# Patient Record
Sex: Female | Born: 1945 | Hispanic: No | Marital: Married | Smoking: Never smoker
Health system: Southern US, Community
[De-identification: ages and names within clinical notes are randomized; demographics above are authoritative.]

## PROBLEM LIST (undated history)

## (undated) DIAGNOSIS — M1990 Unspecified osteoarthritis, unspecified site: Secondary | ICD-10-CM

## (undated) DIAGNOSIS — T8859XA Other complications of anesthesia, initial encounter: Secondary | ICD-10-CM

## (undated) DIAGNOSIS — I10 Essential (primary) hypertension: Secondary | ICD-10-CM

## (undated) DIAGNOSIS — T4145XA Adverse effect of unspecified anesthetic, initial encounter: Secondary | ICD-10-CM

## (undated) HISTORY — DX: Essential (primary) hypertension: I10

## (undated) HISTORY — DX: Other complications of anesthesia, initial encounter: T88.59XA

## (undated) HISTORY — DX: Unspecified osteoarthritis, unspecified site: M19.90

## (undated) HISTORY — DX: Adverse effect of unspecified anesthetic, initial encounter: T41.45XA

---

## 1980-06-06 HISTORY — PX: CHOLECYSTECTOMY: SHX55

## 1980-06-06 HISTORY — PX: STOMACH SURGERY: SHX791

## 1985-06-06 HISTORY — PX: SHOULDER SURGERY: SHX246

## 1997-06-06 HISTORY — PX: HAND SURGERY: SHX662

## 2015-10-13 DIAGNOSIS — Z961 Presence of intraocular lens: Secondary | ICD-10-CM | POA: Diagnosis not present

## 2016-06-10 DIAGNOSIS — M546 Pain in thoracic spine: Secondary | ICD-10-CM | POA: Diagnosis not present

## 2016-06-10 DIAGNOSIS — M25511 Pain in right shoulder: Secondary | ICD-10-CM | POA: Diagnosis not present

## 2016-06-10 DIAGNOSIS — M9902 Segmental and somatic dysfunction of thoracic region: Secondary | ICD-10-CM | POA: Diagnosis not present

## 2016-06-10 DIAGNOSIS — M9907 Segmental and somatic dysfunction of upper extremity: Secondary | ICD-10-CM | POA: Diagnosis not present

## 2016-06-14 DIAGNOSIS — M9902 Segmental and somatic dysfunction of thoracic region: Secondary | ICD-10-CM | POA: Diagnosis not present

## 2016-06-14 DIAGNOSIS — M25511 Pain in right shoulder: Secondary | ICD-10-CM | POA: Diagnosis not present

## 2016-06-14 DIAGNOSIS — M9907 Segmental and somatic dysfunction of upper extremity: Secondary | ICD-10-CM | POA: Diagnosis not present

## 2016-06-14 DIAGNOSIS — M546 Pain in thoracic spine: Secondary | ICD-10-CM | POA: Diagnosis not present

## 2016-06-16 DIAGNOSIS — M546 Pain in thoracic spine: Secondary | ICD-10-CM | POA: Diagnosis not present

## 2016-06-16 DIAGNOSIS — M9902 Segmental and somatic dysfunction of thoracic region: Secondary | ICD-10-CM | POA: Diagnosis not present

## 2016-06-16 DIAGNOSIS — M25511 Pain in right shoulder: Secondary | ICD-10-CM | POA: Diagnosis not present

## 2016-06-16 DIAGNOSIS — M9907 Segmental and somatic dysfunction of upper extremity: Secondary | ICD-10-CM | POA: Diagnosis not present

## 2016-06-20 DIAGNOSIS — M9907 Segmental and somatic dysfunction of upper extremity: Secondary | ICD-10-CM | POA: Diagnosis not present

## 2016-06-20 DIAGNOSIS — M9902 Segmental and somatic dysfunction of thoracic region: Secondary | ICD-10-CM | POA: Diagnosis not present

## 2016-06-20 DIAGNOSIS — M25511 Pain in right shoulder: Secondary | ICD-10-CM | POA: Diagnosis not present

## 2016-06-20 DIAGNOSIS — M546 Pain in thoracic spine: Secondary | ICD-10-CM | POA: Diagnosis not present

## 2016-06-27 DIAGNOSIS — M546 Pain in thoracic spine: Secondary | ICD-10-CM | POA: Diagnosis not present

## 2016-06-27 DIAGNOSIS — M25511 Pain in right shoulder: Secondary | ICD-10-CM | POA: Diagnosis not present

## 2016-06-27 DIAGNOSIS — M9907 Segmental and somatic dysfunction of upper extremity: Secondary | ICD-10-CM | POA: Diagnosis not present

## 2016-06-27 DIAGNOSIS — M9902 Segmental and somatic dysfunction of thoracic region: Secondary | ICD-10-CM | POA: Diagnosis not present

## 2016-07-01 DIAGNOSIS — M9907 Segmental and somatic dysfunction of upper extremity: Secondary | ICD-10-CM | POA: Diagnosis not present

## 2016-07-01 DIAGNOSIS — M25511 Pain in right shoulder: Secondary | ICD-10-CM | POA: Diagnosis not present

## 2016-07-01 DIAGNOSIS — M9902 Segmental and somatic dysfunction of thoracic region: Secondary | ICD-10-CM | POA: Diagnosis not present

## 2016-07-01 DIAGNOSIS — M546 Pain in thoracic spine: Secondary | ICD-10-CM | POA: Diagnosis not present

## 2016-07-06 DIAGNOSIS — M9902 Segmental and somatic dysfunction of thoracic region: Secondary | ICD-10-CM | POA: Diagnosis not present

## 2016-07-06 DIAGNOSIS — M25511 Pain in right shoulder: Secondary | ICD-10-CM | POA: Diagnosis not present

## 2016-07-06 DIAGNOSIS — M9907 Segmental and somatic dysfunction of upper extremity: Secondary | ICD-10-CM | POA: Diagnosis not present

## 2016-07-06 DIAGNOSIS — M546 Pain in thoracic spine: Secondary | ICD-10-CM | POA: Diagnosis not present

## 2016-07-12 DIAGNOSIS — M25511 Pain in right shoulder: Secondary | ICD-10-CM | POA: Diagnosis not present

## 2016-07-12 DIAGNOSIS — M546 Pain in thoracic spine: Secondary | ICD-10-CM | POA: Diagnosis not present

## 2016-07-12 DIAGNOSIS — M9907 Segmental and somatic dysfunction of upper extremity: Secondary | ICD-10-CM | POA: Diagnosis not present

## 2016-07-12 DIAGNOSIS — M9902 Segmental and somatic dysfunction of thoracic region: Secondary | ICD-10-CM | POA: Diagnosis not present

## 2016-07-21 DIAGNOSIS — M9907 Segmental and somatic dysfunction of upper extremity: Secondary | ICD-10-CM | POA: Diagnosis not present

## 2016-07-21 DIAGNOSIS — M25511 Pain in right shoulder: Secondary | ICD-10-CM | POA: Diagnosis not present

## 2016-07-21 DIAGNOSIS — M9902 Segmental and somatic dysfunction of thoracic region: Secondary | ICD-10-CM | POA: Diagnosis not present

## 2016-07-21 DIAGNOSIS — M546 Pain in thoracic spine: Secondary | ICD-10-CM | POA: Diagnosis not present

## 2016-07-25 ENCOUNTER — Ambulatory Visit (INDEPENDENT_AMBULATORY_CARE_PROVIDER_SITE_OTHER): Payer: Medicare Other | Admitting: Orthopedic Surgery

## 2016-07-25 ENCOUNTER — Ambulatory Visit (HOSPITAL_COMMUNITY)
Admission: RE | Admit: 2016-07-25 | Discharge: 2016-07-25 | Disposition: A | Discharge status: Home / Self Care | Payer: Medicare Other | Source: Ambulatory Visit | Attending: Orthopedic Surgery | Admitting: Orthopedic Surgery

## 2016-07-25 ENCOUNTER — Encounter: Payer: Self-pay | Admitting: Orthopedic Surgery

## 2016-07-25 VITALS — BP 169/80 | HR 92 | Ht 60.5 in | Wt 157.0 lb

## 2016-07-25 DIAGNOSIS — M7551 Bursitis of right shoulder: Secondary | ICD-10-CM | POA: Diagnosis not present

## 2016-07-25 DIAGNOSIS — M19011 Primary osteoarthritis, right shoulder: Secondary | ICD-10-CM | POA: Diagnosis not present

## 2016-07-25 DIAGNOSIS — M25511 Pain in right shoulder: Secondary | ICD-10-CM | POA: Insufficient documentation

## 2016-07-25 DIAGNOSIS — I708 Atherosclerosis of other arteries: Secondary | ICD-10-CM | POA: Insufficient documentation

## 2016-07-25 DIAGNOSIS — S4991XA Unspecified injury of right shoulder and upper arm, initial encounter: Secondary | ICD-10-CM | POA: Diagnosis not present

## 2016-07-25 NOTE — Progress Notes (Signed)
New Patient History and Physical   Patient ID: Laura Mercado, female   DOB: 10-20-1945, 71 y.o.   MRN: [id number]  Chief Complaint  Patient presents with  . Shoulder Pain    right shoulder pain s/p fall 05/15/17    HPI Laura Mercado is a 71 y.o. female.  Who presents with painful right shoulder after falling on December 10. She had a rotator cuff repair about 30 years ago. Well. She moved into a new house recently and was moving boxes and felt some discomfort in the shoulder but nothing serious or severe until she fell. She actually landed on the left side the right shoulder jerked and since that time she's had persistent pain over the anterolateral right shoulder. Her range of motion has improved since that time but still having pain and catching at 90 flexion.  Review of Systems Review of Systems  Musculoskeletal: Positive for arthralgias.  Neurological: Negative for weakness and numbness.    Past Medical History:  Diagnosis Date  . Arthritis   . Complication of anesthesia   . High blood pressure     Past Surgical History:  Procedure Laterality Date  . CHOLECYSTECTOMY  1982  . HAND SURGERY Right 1999   basil joint  . SHOULDER SURGERY Right 1987   rotator cuff  . STOMACH SURGERY  1982   weight loss band    Family History  Problem Relation Age of Onset  . Heart disease Mother   . Alzheimer's disease Father     Social History Social History  Substance Use Topics  . Smoking status: Never Smoker  . Smokeless tobacco: Never Used  . Alcohol use Not on file    Allergies  Allergen Reactions  . Codeine     No current outpatient prescriptions on file.   No current facility-administered medications for this visit.     Physical Exam BP (!) 169/80   Pulse 92   Ht 5' 0.5" (1.537 m)   Wt 157 lb (71.2 kg)   BMI 30.16 kg/m  Physical Exam  Constitutional: She is oriented to person, place, and time. She appears well-developed and well-nourished. No distress.   Cardiovascular: Normal rate and intact distal pulses.   Neurological: She is alert and oriented to person, place, and time. She has normal reflexes. She exhibits normal muscle tone. Coordination normal.  Skin: Skin is warm and dry. No rash noted. She is not diaphoretic. No erythema. No pallor.  Psychiatric: She has a normal mood and affect. Her behavior is normal. Judgment and thought content normal.   Ambulatory status normal with no assistive devices Right Shoulder Exam   Tenderness  The patient is experiencing tenderness in the acromion.  Range of Motion  Active Abduction: normal  Passive Abduction: normal  Forward Flexion: 150  External Rotation: normal  Internal Rotation 0 degrees: abnormal   Muscle Strength  The patient has normal right shoulder strength. Abduction: 5/5  Internal Rotation: 5/5  External Rotation: 5/5  Supraspinatus: 5/5  Subscapularis: 5/5  Biceps: 5/5   Tests  Apprehension: negative Drop Arm: negative Impingement: positive  Other  Erythema: absent Sensation: normal Pulse: present   Left Shoulder Exam  Left shoulder exam is normal.  Tenderness  The patient is experiencing no tenderness.     Range of Motion  The patient has normal left shoulder ROM.  Muscle Strength  The patient has normal left shoulder strength.  Tests  Apprehension: negative  Other  Erythema: absent Sensation: normal Pulse: present  Cervical spine nontender  Data Reviewed Imaging of the right shoulder are independently reviewed and I interpreted these as prior distal clavicle excision to be a nasal acromion, slight proximal migration and decreased humeral head acromial distance  Assessment  Encounter Diagnoses  Name Primary?  . Right shoulder pain, unspecified chronicity Yes  . Bursitis of right shoulder       Plan  Recommend shoulder exercises +  injection right subacromial space.  However she does not want to have the injection so we  decided to stick with a home exercise program

## 2016-07-25 NOTE — Patient Instructions (Signed)
Home exercises for 4 weeks

## 2016-09-17 DIAGNOSIS — Z0000 Encounter for general adult medical examination without abnormal findings: Secondary | ICD-10-CM | POA: Diagnosis not present

## 2016-09-17 DIAGNOSIS — H6500 Acute serous otitis media, unspecified ear: Secondary | ICD-10-CM | POA: Diagnosis not present

## 2016-09-19 DIAGNOSIS — D45 Polycythemia vera: Secondary | ICD-10-CM | POA: Diagnosis not present

## 2016-09-19 DIAGNOSIS — Z6827 Body mass index (BMI) 27.0-27.9, adult: Secondary | ICD-10-CM | POA: Diagnosis not present

## 2016-09-19 DIAGNOSIS — R05 Cough: Secondary | ICD-10-CM | POA: Diagnosis not present

## 2016-09-19 DIAGNOSIS — H6500 Acute serous otitis media, unspecified ear: Secondary | ICD-10-CM | POA: Diagnosis not present

## 2016-09-24 DIAGNOSIS — H9209 Otalgia, unspecified ear: Secondary | ICD-10-CM | POA: Diagnosis not present

## 2016-09-24 DIAGNOSIS — Z6827 Body mass index (BMI) 27.0-27.9, adult: Secondary | ICD-10-CM | POA: Diagnosis not present

## 2016-09-24 DIAGNOSIS — H6500 Acute serous otitis media, unspecified ear: Secondary | ICD-10-CM | POA: Diagnosis not present

## 2016-10-11 DIAGNOSIS — E782 Mixed hyperlipidemia: Secondary | ICD-10-CM | POA: Diagnosis not present

## 2016-10-11 DIAGNOSIS — R946 Abnormal results of thyroid function studies: Secondary | ICD-10-CM | POA: Diagnosis not present

## 2016-10-13 DIAGNOSIS — D45 Polycythemia vera: Secondary | ICD-10-CM | POA: Diagnosis not present

## 2016-10-13 DIAGNOSIS — F5101 Primary insomnia: Secondary | ICD-10-CM | POA: Diagnosis not present

## 2016-10-13 DIAGNOSIS — J309 Allergic rhinitis, unspecified: Secondary | ICD-10-CM | POA: Diagnosis not present

## 2016-10-13 DIAGNOSIS — E782 Mixed hyperlipidemia: Secondary | ICD-10-CM | POA: Diagnosis not present

## 2016-10-13 DIAGNOSIS — Z6827 Body mass index (BMI) 27.0-27.9, adult: Secondary | ICD-10-CM | POA: Diagnosis not present

## 2017-09-18 DIAGNOSIS — M5137 Other intervertebral disc degeneration, lumbosacral region: Secondary | ICD-10-CM | POA: Diagnosis not present

## 2017-09-18 DIAGNOSIS — M9905 Segmental and somatic dysfunction of pelvic region: Secondary | ICD-10-CM | POA: Diagnosis not present

## 2017-09-20 DIAGNOSIS — M9905 Segmental and somatic dysfunction of pelvic region: Secondary | ICD-10-CM | POA: Diagnosis not present

## 2017-09-20 DIAGNOSIS — M5137 Other intervertebral disc degeneration, lumbosacral region: Secondary | ICD-10-CM | POA: Diagnosis not present

## 2017-09-21 DIAGNOSIS — M5137 Other intervertebral disc degeneration, lumbosacral region: Secondary | ICD-10-CM | POA: Diagnosis not present

## 2017-09-21 DIAGNOSIS — M9905 Segmental and somatic dysfunction of pelvic region: Secondary | ICD-10-CM | POA: Diagnosis not present

## 2017-09-25 DIAGNOSIS — M9905 Segmental and somatic dysfunction of pelvic region: Secondary | ICD-10-CM | POA: Diagnosis not present

## 2017-09-25 DIAGNOSIS — M5137 Other intervertebral disc degeneration, lumbosacral region: Secondary | ICD-10-CM | POA: Diagnosis not present

## 2017-09-26 DIAGNOSIS — M5137 Other intervertebral disc degeneration, lumbosacral region: Secondary | ICD-10-CM | POA: Diagnosis not present

## 2017-09-26 DIAGNOSIS — M9905 Segmental and somatic dysfunction of pelvic region: Secondary | ICD-10-CM | POA: Diagnosis not present

## 2017-09-27 DIAGNOSIS — M9905 Segmental and somatic dysfunction of pelvic region: Secondary | ICD-10-CM | POA: Diagnosis not present

## 2017-09-27 DIAGNOSIS — M5137 Other intervertebral disc degeneration, lumbosacral region: Secondary | ICD-10-CM | POA: Diagnosis not present

## 2017-10-02 DIAGNOSIS — M9905 Segmental and somatic dysfunction of pelvic region: Secondary | ICD-10-CM | POA: Diagnosis not present

## 2017-10-02 DIAGNOSIS — M5137 Other intervertebral disc degeneration, lumbosacral region: Secondary | ICD-10-CM | POA: Diagnosis not present

## 2017-10-04 DIAGNOSIS — M9905 Segmental and somatic dysfunction of pelvic region: Secondary | ICD-10-CM | POA: Diagnosis not present

## 2017-10-04 DIAGNOSIS — M5137 Other intervertebral disc degeneration, lumbosacral region: Secondary | ICD-10-CM | POA: Diagnosis not present

## 2017-10-05 DIAGNOSIS — M5137 Other intervertebral disc degeneration, lumbosacral region: Secondary | ICD-10-CM | POA: Diagnosis not present

## 2017-10-05 DIAGNOSIS — M9905 Segmental and somatic dysfunction of pelvic region: Secondary | ICD-10-CM | POA: Diagnosis not present

## 2017-10-09 DIAGNOSIS — M9905 Segmental and somatic dysfunction of pelvic region: Secondary | ICD-10-CM | POA: Diagnosis not present

## 2017-10-09 DIAGNOSIS — M5137 Other intervertebral disc degeneration, lumbosacral region: Secondary | ICD-10-CM | POA: Diagnosis not present

## 2017-10-12 DIAGNOSIS — M9905 Segmental and somatic dysfunction of pelvic region: Secondary | ICD-10-CM | POA: Diagnosis not present

## 2017-10-12 DIAGNOSIS — M5137 Other intervertebral disc degeneration, lumbosacral region: Secondary | ICD-10-CM | POA: Diagnosis not present

## 2017-10-16 DIAGNOSIS — M9905 Segmental and somatic dysfunction of pelvic region: Secondary | ICD-10-CM | POA: Diagnosis not present

## 2017-10-16 DIAGNOSIS — M5137 Other intervertebral disc degeneration, lumbosacral region: Secondary | ICD-10-CM | POA: Diagnosis not present

## 2017-10-18 DIAGNOSIS — M9905 Segmental and somatic dysfunction of pelvic region: Secondary | ICD-10-CM | POA: Diagnosis not present

## 2017-10-18 DIAGNOSIS — M5137 Other intervertebral disc degeneration, lumbosacral region: Secondary | ICD-10-CM | POA: Diagnosis not present

## 2017-10-19 DIAGNOSIS — M5137 Other intervertebral disc degeneration, lumbosacral region: Secondary | ICD-10-CM | POA: Diagnosis not present

## 2017-10-19 DIAGNOSIS — M9905 Segmental and somatic dysfunction of pelvic region: Secondary | ICD-10-CM | POA: Diagnosis not present

## 2017-11-07 DIAGNOSIS — E782 Mixed hyperlipidemia: Secondary | ICD-10-CM | POA: Diagnosis not present

## 2017-11-10 DIAGNOSIS — J309 Allergic rhinitis, unspecified: Secondary | ICD-10-CM | POA: Diagnosis not present

## 2017-11-10 DIAGNOSIS — Z6831 Body mass index (BMI) 31.0-31.9, adult: Secondary | ICD-10-CM | POA: Diagnosis not present

## 2017-11-10 DIAGNOSIS — M545 Low back pain: Secondary | ICD-10-CM | POA: Diagnosis not present

## 2017-11-10 DIAGNOSIS — D45 Polycythemia vera: Secondary | ICD-10-CM | POA: Diagnosis not present

## 2017-11-10 DIAGNOSIS — G4700 Insomnia, unspecified: Secondary | ICD-10-CM | POA: Diagnosis not present

## 2017-11-10 DIAGNOSIS — L723 Sebaceous cyst: Secondary | ICD-10-CM | POA: Diagnosis not present

## 2017-11-10 DIAGNOSIS — E782 Mixed hyperlipidemia: Secondary | ICD-10-CM | POA: Diagnosis not present

## 2018-02-13 DIAGNOSIS — Z1211 Encounter for screening for malignant neoplasm of colon: Secondary | ICD-10-CM | POA: Diagnosis not present

## 2018-02-13 DIAGNOSIS — Z1212 Encounter for screening for malignant neoplasm of rectum: Secondary | ICD-10-CM | POA: Diagnosis not present

## 2018-04-23 ENCOUNTER — Other Ambulatory Visit: Payer: Self-pay

## 2018-07-02 DIAGNOSIS — R062 Wheezing: Secondary | ICD-10-CM | POA: Diagnosis not present

## 2018-07-02 DIAGNOSIS — R509 Fever, unspecified: Secondary | ICD-10-CM | POA: Diagnosis not present

## 2018-07-02 DIAGNOSIS — J209 Acute bronchitis, unspecified: Secondary | ICD-10-CM | POA: Diagnosis not present

## 2018-07-02 DIAGNOSIS — J060 Acute laryngopharyngitis: Secondary | ICD-10-CM | POA: Diagnosis not present

## 2018-10-02 DIAGNOSIS — Z0000 Encounter for general adult medical examination without abnormal findings: Secondary | ICD-10-CM | POA: Diagnosis not present

## 2018-12-01 DIAGNOSIS — I10 Essential (primary) hypertension: Secondary | ICD-10-CM | POA: Diagnosis not present

## 2018-12-01 DIAGNOSIS — F418 Other specified anxiety disorders: Secondary | ICD-10-CM | POA: Diagnosis not present

## 2018-12-15 DIAGNOSIS — F418 Other specified anxiety disorders: Secondary | ICD-10-CM | POA: Diagnosis not present

## 2018-12-15 DIAGNOSIS — K122 Cellulitis and abscess of mouth: Secondary | ICD-10-CM | POA: Diagnosis not present

## 2018-12-15 DIAGNOSIS — I10 Essential (primary) hypertension: Secondary | ICD-10-CM | POA: Diagnosis not present

## 2018-12-24 DIAGNOSIS — E782 Mixed hyperlipidemia: Secondary | ICD-10-CM | POA: Diagnosis not present

## 2018-12-24 DIAGNOSIS — I10 Essential (primary) hypertension: Secondary | ICD-10-CM | POA: Diagnosis not present

## 2019-01-01 DIAGNOSIS — D45 Polycythemia vera: Secondary | ICD-10-CM | POA: Diagnosis not present

## 2019-01-01 DIAGNOSIS — G4700 Insomnia, unspecified: Secondary | ICD-10-CM | POA: Diagnosis not present

## 2019-01-01 DIAGNOSIS — E782 Mixed hyperlipidemia: Secondary | ICD-10-CM | POA: Diagnosis not present

## 2019-01-01 DIAGNOSIS — E875 Hyperkalemia: Secondary | ICD-10-CM | POA: Diagnosis not present

## 2019-01-01 DIAGNOSIS — Z0001 Encounter for general adult medical examination with abnormal findings: Secondary | ICD-10-CM | POA: Diagnosis not present

## 2019-01-01 DIAGNOSIS — M545 Low back pain: Secondary | ICD-10-CM | POA: Diagnosis not present

## 2019-01-01 DIAGNOSIS — Z6831 Body mass index (BMI) 31.0-31.9, adult: Secondary | ICD-10-CM | POA: Diagnosis not present

## 2019-02-02 IMAGING — DX DG SHOULDER 2+V*R*
3 series · 3 of 3 positions shown · non-contrast
Comparison: None.

CLINICAL DATA: Pain following recent fall

EXAM:
RIGHT SHOULDER - 2+ VIEW

[shoulder ap]
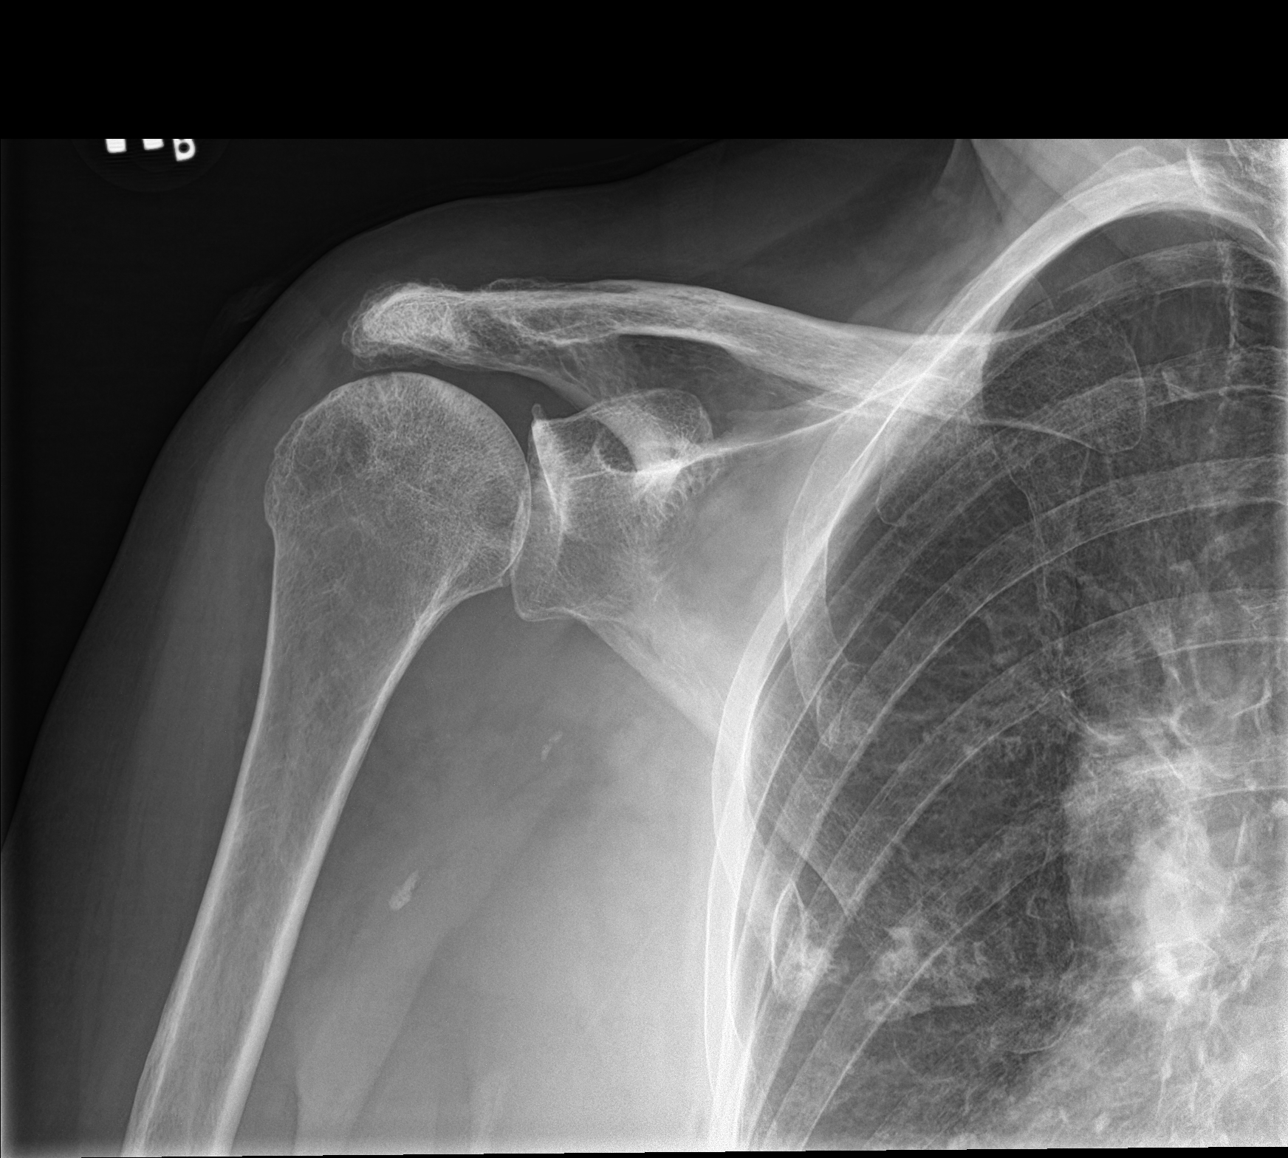

[shoulder y view]
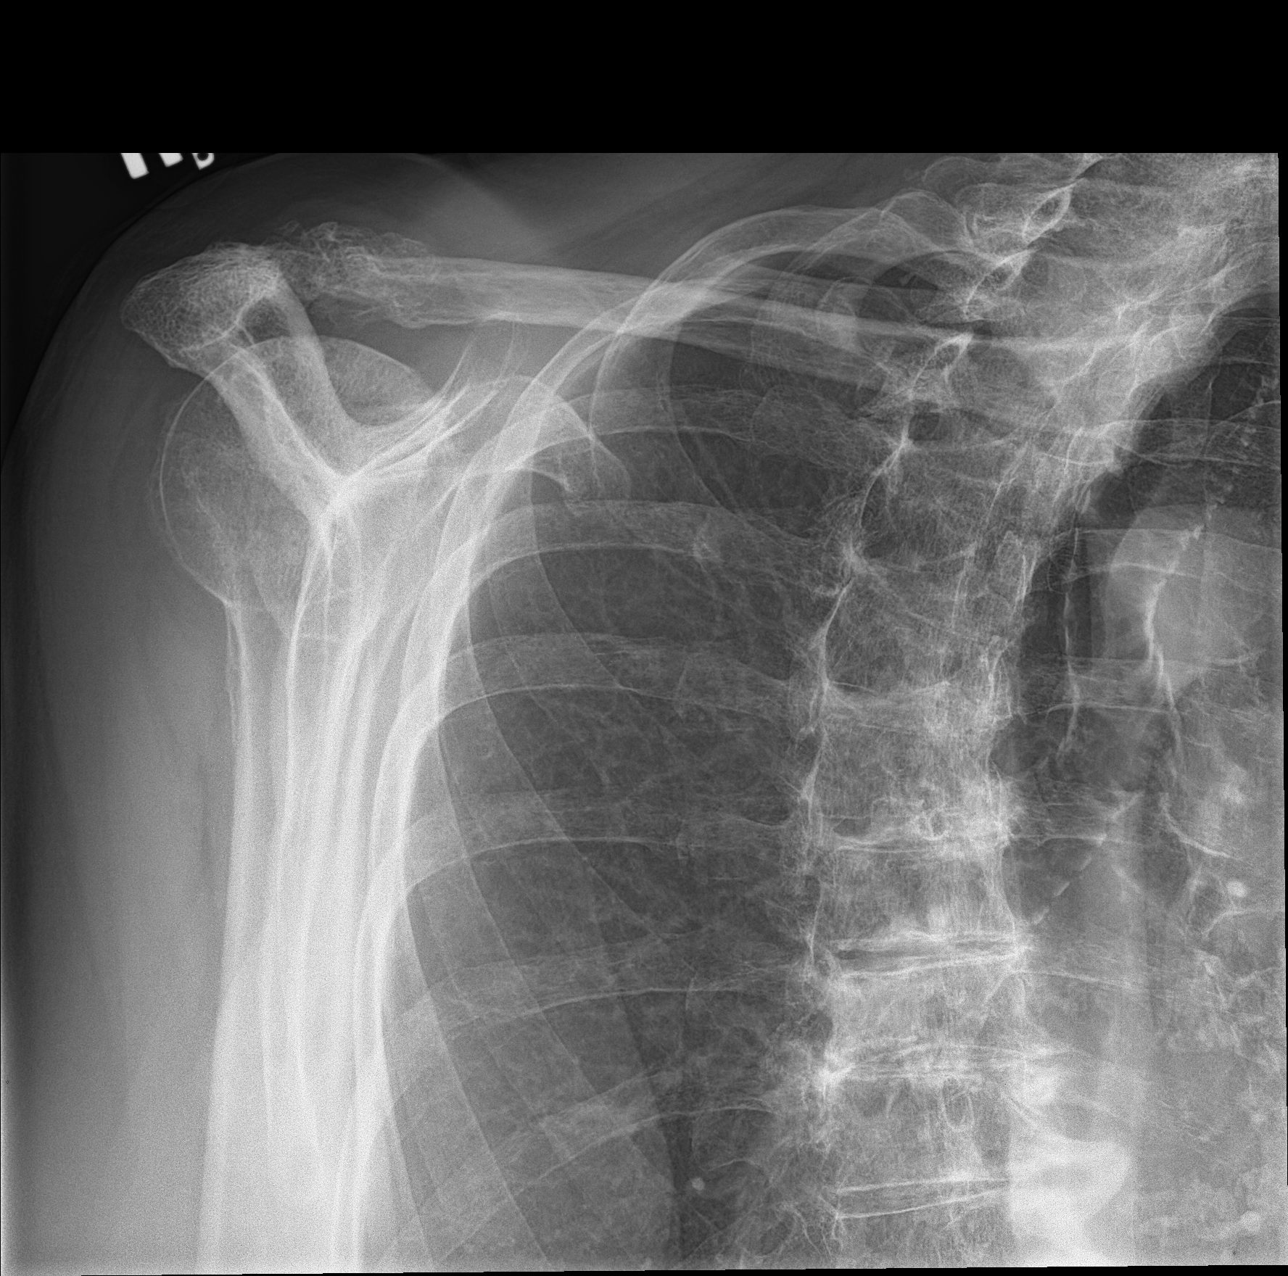

[shoulder axillary]
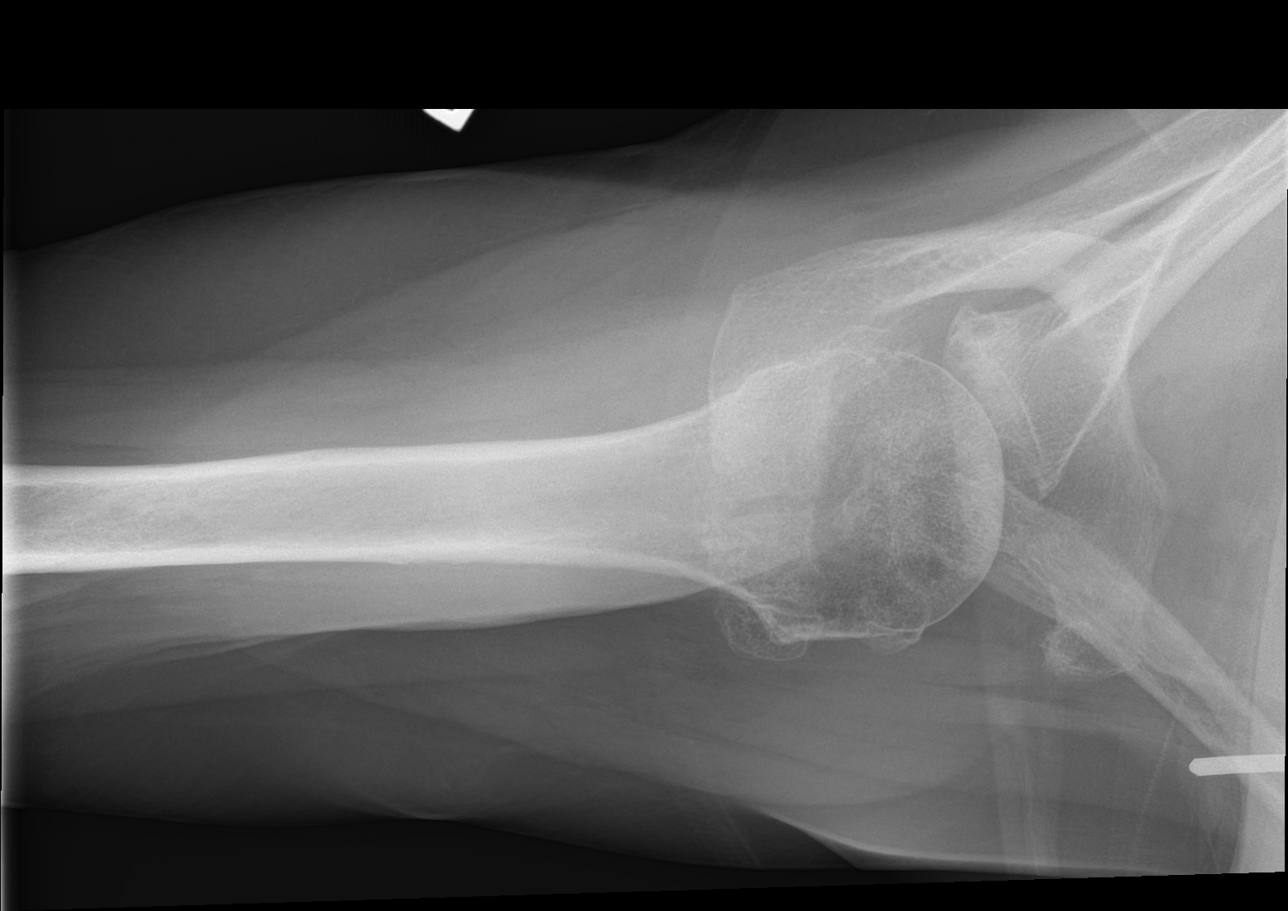

[3 of 3 positions shown; findings below may reference images not displayed]

FINDINGS: Frontal, Y scapular, and axillary images were obtained. There is
evidence of old trauma involving the lateral right clavicle with
remodeling. No acute fracture dislocation is moderate generalized
osteoarthritic change. No erosive change. There are foci of
atherosclerotic calcification in the right tracheal and right
axillary arteries. Visualized right lung clear.
IMPRESSION: Moderate generalized osteoarthritic change. Old trauma distal right
clavicle with remodeling. No acute fracture or dislocation. Foci of
arterial vascular calcifications/atherosclerosis.

## 2019-10-12 ENCOUNTER — Ambulatory Visit (INDEPENDENT_AMBULATORY_CARE_PROVIDER_SITE_OTHER): Payer: Medicare Other

## 2019-10-12 ENCOUNTER — Ambulatory Visit
Admission: EM | Admit: 2019-10-12 | Discharge: 2019-10-12 | Disposition: A | Discharge status: Home / Self Care | Payer: Medicare Other

## 2019-10-12 ENCOUNTER — Encounter: Payer: Self-pay | Admitting: Emergency Medicine

## 2019-10-12 ENCOUNTER — Other Ambulatory Visit: Payer: Self-pay

## 2019-10-12 DIAGNOSIS — M25512 Pain in left shoulder: Secondary | ICD-10-CM

## 2019-10-12 DIAGNOSIS — R0789 Other chest pain: Secondary | ICD-10-CM

## 2019-10-12 DIAGNOSIS — J9811 Atelectasis: Secondary | ICD-10-CM | POA: Diagnosis not present

## 2019-10-12 NOTE — Discharge Instructions (Signed)
Take OTC Tylenol/ibuprofen as needed for pain Follow-up with PCP for further evaluation if symptom does not resolve Return or go to ED for worsening of symptoms

## 2019-10-12 NOTE — ED Provider Notes (Signed)
[facility]    CSN: [id number] Arrival date & time: 10/12/19  0955      History   Chief Complaint Chief Complaint  Patient presents with  . Chest Pain    HPI Laura Mercado is a 74 y.o. female.   Who presented to the urgent care with a complaint of left clavicular pain and ecchymosis for the past 3 days.  Reported she was pulling herself into a golf cart.  Does not remember if she hit the golf cart bar.she localizes the pain to her left clavicular area.  She described the pain as constant and achy.  Has tried OTC medication without relief.  Symptoms are made worse with range of motion.  Denies similar symptoms in the past.  Denies chills, fever, nausea, vomiting, diarrhea.  The history is provided by the patient. No language interpreter was used.  Chest Pain   Past Medical History:  Diagnosis Date  . Arthritis   . Complication of anesthesia   . High blood pressure     There are no problems to display for this patient.   Past Surgical History:  Procedure Laterality Date  . CHOLECYSTECTOMY  1982  . HAND SURGERY Right 1999   basil joint  . SHOULDER SURGERY Right 1987   rotator cuff  . STOMACH SURGERY  1982   weight loss band    OB History   No obstetric history on file.      Home Medications    Prior to Admission medications   Medication Sig Start Date End Date Taking? Authorizing Provider  loratadine (CLARITIN) 10 MG tablet Take 10 mg by mouth daily.   Yes [provider]    Family History Family History  Problem Relation Age of Onset  . Heart disease Mother   . Alzheimer's disease Father     Social History Social History   Tobacco Use  . Smoking status: Never Smoker  . Smokeless tobacco: Never Used  Substance Use Topics  . Alcohol use: Not on file  . Drug use: Not on file     Allergies   Codeine   Review of Systems Review of Systems  Constitutional: Negative.   Respiratory: Negative.   Gastrointestinal:  Negative.   Musculoskeletal: Positive for arthralgias.  All other systems reviewed and are negative.    Physical Exam Triage Vital Signs ED Triage Vitals [10/12/19 1005]  Enc Vitals Group     BP (!) 179/75     Pulse Rate 90     Resp 18     Temp 98.4 F (36.9 C)     Temp Source Oral     SpO2 97 %     Weight      Height      Head Circumference      Peak Flow      Pain Score 6     Pain Loc      Pain Edu?      Excl. in [place]?    No data found.  Updated Vital Signs BP (!) 179/75 (BP Location: Right Arm)   Pulse 90   Temp 98.4 F (36.9 C) (Oral)   Resp 18   SpO2 97%   Visual Acuity Right Eye Distance:   Left Eye Distance:   Bilateral Distance:    Right Eye Near:   Left Eye Near:    Bilateral Near:     Physical Exam Vitals and nursing note reviewed.  Constitutional:      General: She is  not in acute distress.    Appearance: She is well-developed and normal weight. She is not ill-appearing, toxic-appearing or diaphoretic.  Cardiovascular:     Rate and Rhythm: Normal rate and regular rhythm.     Pulses: Normal pulses.     Heart sounds: Normal heart sounds. No murmur. No friction rub. No gallop.   Pulmonary:     Effort: Pulmonary effort is normal. No respiratory distress.     Breath sounds: Normal breath sounds. No stridor. No wheezing, rhonchi or rales.  Chest:     Chest wall: No tenderness.  Musculoskeletal:        General: Tenderness present.     Right shoulder: Normal.     Left shoulder: No swelling, effusion, laceration or tenderness.     Comments: Limited range of motion of left shoulder.  There is no obvious deformity or asymmetry when compared to the left shoulder to the right.  Neurological:     Mental Status: She is alert and oriented to person, place, and time.     Sensory: No sensory deficit.      UC Treatments / Results  Labs (all labs ordered are listed, but only abnormal results are displayed) Labs Reviewed - No data to  display  EKG   Radiology DG Chest 2 View  Result Date: 10/12/2019 CLINICAL DATA:  Pain after heavy lifting EXAM: CHEST - 2 VIEW COMPARISON:  None. FINDINGS: There is bibasilar atelectatic change. Lungs otherwise are clear. Heart size and pulmonary vascularity are normal. There is aortic atherosclerosis. There is thoracolumbar levoscoliosis. There is degenerative change in each shoulder. No clavicular fracture or dislocation appreciable. There is mild anterior wedging of a midthoracic vertebral body. IMPRESSION: Bibasilar atelectasis. Lungs otherwise clear. Heart size normal. Aortic Atherosclerosis (ICD10-I70.0). Mild anterior wedging of a midthoracic vertebral body. Degenerative change in each shoulder. No clavicular fracture evident. If there remains concern for pathology involving the left clavicle, would advise dedicated clavicular images to further evaluate. Electronically Signed   By: Lowella Grip III M.D.   On: 10/12/2019 10:37    Procedures Procedures (including critical care time)  Medications Ordered in UC Medications - No data to display  Initial Impression / Assessment and Plan / UC Course  I have reviewed the triage vital signs and the nursing notes.  Pertinent labs & imaging results that were available during my care of the patient were reviewed by me and considered in my medical decision making (see chart for details).    Patient is stable at discharge.  Chest x-ray is negative for bony abnormality including fracture or dislocation.  I have reviewed the x-ray myself and the radiologist interpretation.  I am in agreement with the radiologist interpretation.  Was advised to take OTC Tylenol/ibuprofen as needed for pain.  To follow-up with PCP if symptoms does not resolve for further evaluation.  Final Clinical Impressions(s) / UC Diagnoses   Final diagnoses:  Arthralgia of left acromioclavicular joint     Discharge Instructions     Take OTC Tylenol/ibuprofen as  needed for pain Follow-up with PCP for further evaluation if symptom does not resolve Return or go to ED for worsening of symptoms    ED Prescriptions    None     PDMP not reviewed this encounter.   Emerson Monte, [place] 10/12/19 1059

## 2019-10-12 NOTE — ED Triage Notes (Signed)
Pt here for pain in left collarbone area after using arm to pull up in golf cart 3 days ago; pt sts felt "pop" and has bruising noted

## 2019-12-19 DIAGNOSIS — D45 Polycythemia vera: Secondary | ICD-10-CM | POA: Diagnosis not present

## 2019-12-19 DIAGNOSIS — M545 Low back pain: Secondary | ICD-10-CM | POA: Diagnosis not present

## 2019-12-19 DIAGNOSIS — G4700 Insomnia, unspecified: Secondary | ICD-10-CM | POA: Diagnosis not present

## 2019-12-19 DIAGNOSIS — E782 Mixed hyperlipidemia: Secondary | ICD-10-CM | POA: Diagnosis not present

## 2019-12-19 DIAGNOSIS — E875 Hyperkalemia: Secondary | ICD-10-CM | POA: Diagnosis not present

## 2019-12-19 DIAGNOSIS — S43202S Unspecified subluxation of left sternoclavicular joint, sequela: Secondary | ICD-10-CM | POA: Diagnosis not present

## 2019-12-19 DIAGNOSIS — R600 Localized edema: Secondary | ICD-10-CM | POA: Diagnosis not present

## 2022-04-10 ENCOUNTER — Ambulatory Visit (INDEPENDENT_AMBULATORY_CARE_PROVIDER_SITE_OTHER): Payer: Medicare Other

## 2022-04-10 ENCOUNTER — Ambulatory Visit
Admission: EM | Admit: 2022-04-10 | Discharge: 2022-04-10 | Disposition: A | Discharge status: Home / Self Care | Payer: Medicare Other | Attending: Family Medicine | Admitting: Family Medicine

## 2022-04-10 DIAGNOSIS — R0782 Intercostal pain: Secondary | ICD-10-CM

## 2022-04-10 DIAGNOSIS — M542 Cervicalgia: Secondary | ICD-10-CM

## 2022-04-10 DIAGNOSIS — W19XXXA Unspecified fall, initial encounter: Secondary | ICD-10-CM

## 2022-04-10 DIAGNOSIS — R0781 Pleurodynia: Secondary | ICD-10-CM

## 2022-04-10 DIAGNOSIS — M47812 Spondylosis without myelopathy or radiculopathy, cervical region: Secondary | ICD-10-CM | POA: Diagnosis not present

## 2022-04-10 MED ORDER — TIZANIDINE HCL 2 MG PO CAPS: 2.0000 mg | ORAL_CAPSULE | Freq: Three times a day (TID) | ORAL | 0 refills | Status: AC | PRN

## 2022-04-10 NOTE — ED Triage Notes (Signed)
Pt reports she fell lastnight in her bedroom while trying to get into her bed. Carpet had ripples and maybe a dogs ball was on the floor. She is experiencing neck pain. Neck is slightly red. Pt reports she does not feel that anything is broke but it is stiff to turn and move. Right rib pain under her breast due to fall.   Took 2 aspirin last night slight relief. Took ibuprofen at 6:30 am but no relief

## 2022-04-10 NOTE — ED Provider Notes (Signed)
[facility]    CSN: [id number] Arrival date & time: 04/10/22  4496      History   Chief Complaint No chief complaint on file.   HPI Laura Mercado is a 76 y.o. female.   Pt reports she fell lastnight in her bedroom while trying to get into her bed. Carpet had ripples and maybe a dogs ball was on the floor. She is experiencing neck pain. Neck is slightly red. Pt reports she does not feel that anything is broke but it is stiff to turn and move. Right rib pain under her breast due to fall.    Took 2 aspirin last night slight relief. Took ibuprofen at 6:30 am but no relief       Past Medical History:  Diagnosis Date   Arthritis    Complication of anesthesia    High blood pressure     There are no problems to display for this patient.   Past Surgical History:  Procedure Laterality Date   CHOLECYSTECTOMY  1982   HAND SURGERY Right 1999   basil joint   SHOULDER SURGERY Right 1987   rotator cuff   STOMACH SURGERY  1982   weight loss band    OB History   No obstetric history on file.      Home Medications    Prior to Admission medications   Medication Sig Start Date End Date Taking? Authorizing Provider  tizanidine (ZANAFLEX) 2 MG capsule Take 1 capsule (2 mg total) by mouth 3 (three) times daily as needed for muscle spasms. Do not drink alcohol or drive while taking this medication.  May cause drowsiness. 04/10/22  Yes Volney American, PA-C  loratadine (CLARITIN) 10 MG tablet Take 10 mg by mouth daily.    [provider]    Family History Family History  Problem Relation Age of Onset   Heart disease Mother    Alzheimer's disease Father     Social History Social History   Tobacco Use   Smoking status: Never   Smokeless tobacco: Never     Allergies   Codeine   Review of Systems Review of Systems PER HPI  Physical Exam Triage Vital Signs ED Triage Vitals [04/10/22 0845]  Enc Vitals Group     BP (!) 185/85      Pulse Rate 98     Resp 20     Temp (!) 97.5 F (36.4 C)     Temp Source Oral     SpO2 97 %     Weight      Height      Head Circumference      Peak Flow      Pain Score 10     Pain Loc      Pain Edu?      Excl. in [place]?    No data found.  Updated Vital Signs BP (!) 185/85 (BP Location: Right Arm)   Pulse 98   Temp (!) 97.5 F (36.4 C) (Oral)   Resp 20   SpO2 97%   Visual Acuity Right Eye Distance:   Left Eye Distance:   Bilateral Distance:    Right Eye Near:   Left Eye Near:    Bilateral Near:     Physical Exam Vitals and nursing note reviewed.  Constitutional:      Appearance: Normal appearance. She is not ill-appearing.  HENT:     Head: Atraumatic.     Mouth/Throat:     Mouth: Mucous membranes  are moist.  Eyes:     Extraocular Movements: Extraocular movements intact.     Conjunctiva/sclera: Conjunctivae normal.  Cardiovascular:     Rate and Rhythm: Normal rate and regular rhythm.     Heart sounds: Normal heart sounds.  Pulmonary:     Effort: Pulmonary effort is normal.     Breath sounds: Normal breath sounds. No wheezing or rales.     Comments: Chest rise symmetric bilaterally Musculoskeletal:        General: Tenderness and signs of injury present. No swelling or deformity. Normal range of motion.     Cervical back: Normal range of motion and neck supple.     Comments: Mild midline cervical spinal tenderness to palpation with no bony deformity palpable, bilateral cervical paraspinal and SCM tenderness to palpation.  Point tenderness over right anterior ribs below breast without bony deformity palpable  Skin:    General: Skin is warm and dry.  Neurological:     Mental Status: She is alert and oriented to person, place, and time.     Motor: No weakness.     Gait: Gait normal.  Psychiatric:        Mood and Affect: Mood normal.        Thought Content: Thought content normal.        Judgment: Judgment normal.      UC Treatments / Results  Labs (all  labs ordered are listed, but only abnormal results are displayed) Labs Reviewed - No data to display  EKG   Radiology DG Ribs Unilateral W/Chest Right  Result Date: 04/10/2022 CLINICAL DATA:  Fall.  Anterior right rib pain. EXAM: RIGHT RIBS AND CHEST - 3+ VIEW COMPARISON:  None Available. FINDINGS: Lungs are hyperexpanded. Linear density at both bases compatible with chronic atelectasis or scarring Cardiopericardial silhouette is at upper limits of normal for size. Degenerative changes noted both shoulders. Bones are diffusely demineralized. Oblique views of the right ribs show no evidence for an acute displaced right-sided rib fracture. IMPRESSION: 1. No evidence for acute displaced right-sided rib fracture. 2. Emphysema with bibasilar atelectasis or scarring. Electronically Signed   By: Misty Stanley M.D.   On: 04/10/2022 10:05   DG Cervical Spine Complete  Result Date: 04/10/2022 CLINICAL DATA:  Neck pain after fall EXAM: CERVICAL SPINE - COMPLETE 4+ VIEW COMPARISON:  None Available. FINDINGS: There is no evidence of cervical spine fracture or prevertebral soft tissue swelling. Grade 1 anterolisthesis of C3 on C4 and C4 on C5, likely secondary to degenerative facet arthropathy. Multilevel intervertebral disc height loss with endplate spurring. Facet and uncovertebral arthropathy contribute to bony foraminal narrowing most pronounced at C6-7 on the left. Chronic appearing deformity of the proximal left clavicle. Aortic atherosclerosis. IMPRESSION: 1. No acute findings in the cervical spine. 2. Advanced multilevel cervical spondylosis, as described above. Electronically Signed   By: Davina Poke D.O.   On: 04/10/2022 09:48    Procedures Procedures (including critical care time)  Medications Ordered in UC Medications - No data to display  Initial Impression / Assessment and Plan / UC Course  I have reviewed the triage vital signs and the nursing notes.  Pertinent labs & imaging results  that were available during my care of the patient were reviewed by me and considered in my medical decision making (see chart for details).     Exam overall reassuring, patient requesting x-rays of both areas to these were performed and negative for acute bony injury today.  Treat with Zanaflex,  over-the-counter pain relievers, heat, rest, massage.  Return for any worsening symptoms.  Final Clinical Impressions(s) / UC Diagnoses   Final diagnoses:  Neck pain  Rib pain on right side  Fall, initial encounter   Discharge Instructions   None    ED Prescriptions     Medication Sig Dispense Auth. Provider   tizanidine (ZANAFLEX) 2 MG capsule Take 1 capsule (2 mg total) by mouth 3 (three) times daily as needed for muscle spasms. Do not drink alcohol or drive while taking this medication.  May cause drowsiness. 15 capsule Volney American, [place]      PDMP not reviewed this encounter.   Volney American, [place] 04/10/22 1533

## 2022-04-13 DIAGNOSIS — Z1211 Encounter for screening for malignant neoplasm of colon: Secondary | ICD-10-CM | POA: Diagnosis not present

## 2022-04-13 DIAGNOSIS — E663 Overweight: Secondary | ICD-10-CM | POA: Diagnosis not present

## 2022-04-13 DIAGNOSIS — R030 Elevated blood-pressure reading, without diagnosis of hypertension: Secondary | ICD-10-CM | POA: Diagnosis not present

## 2022-04-13 DIAGNOSIS — R790 Abnormal level of blood mineral: Secondary | ICD-10-CM | POA: Diagnosis not present

## 2022-04-13 DIAGNOSIS — W19XXXA Unspecified fall, initial encounter: Secondary | ICD-10-CM | POA: Diagnosis not present

## 2022-04-21 IMAGING — DX DG CHEST 2V
2 series · 2 of 2 positions shown · non-contrast
Comparison: None.

CLINICAL DATA: Pain after heavy lifting

EXAM:
CHEST - 2 VIEW

[chest pa]
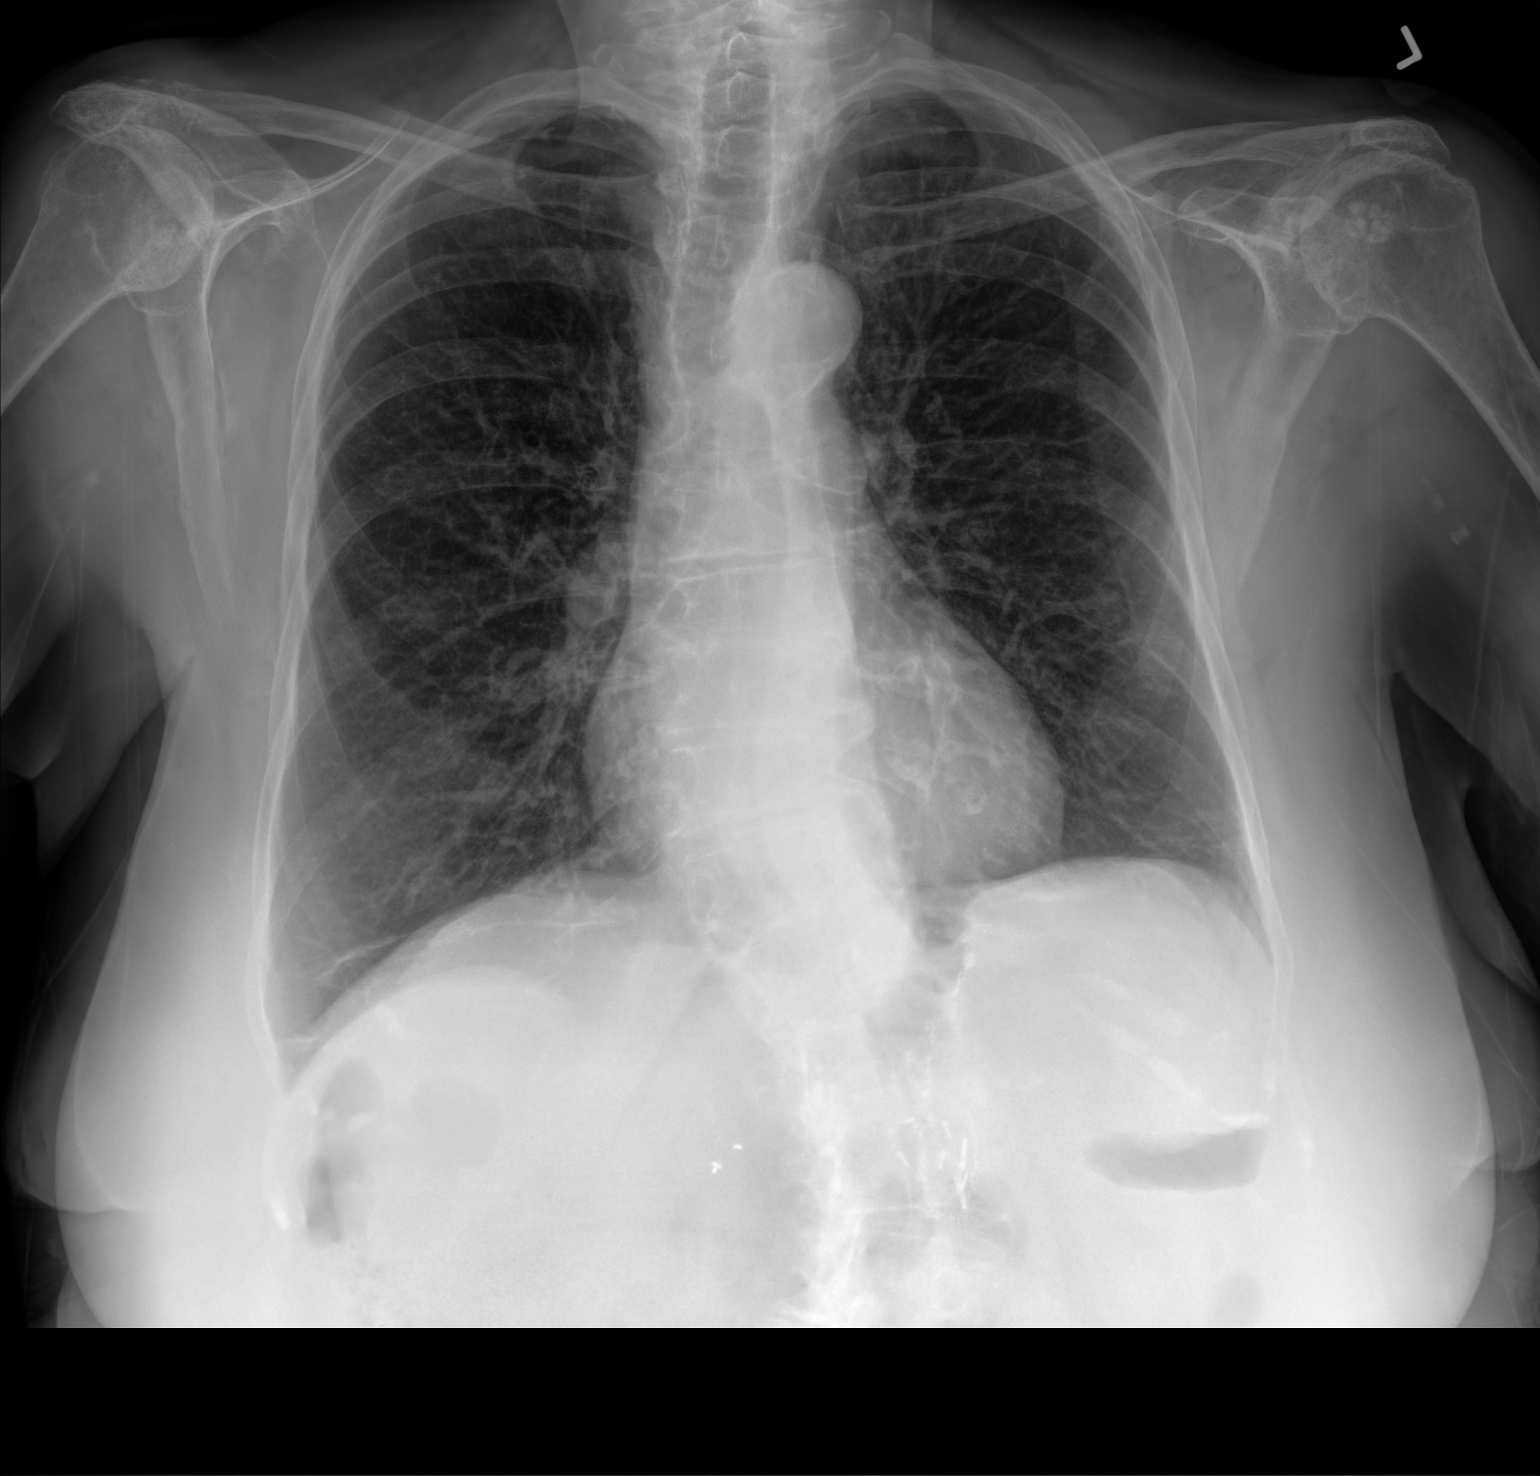

[chest lat]
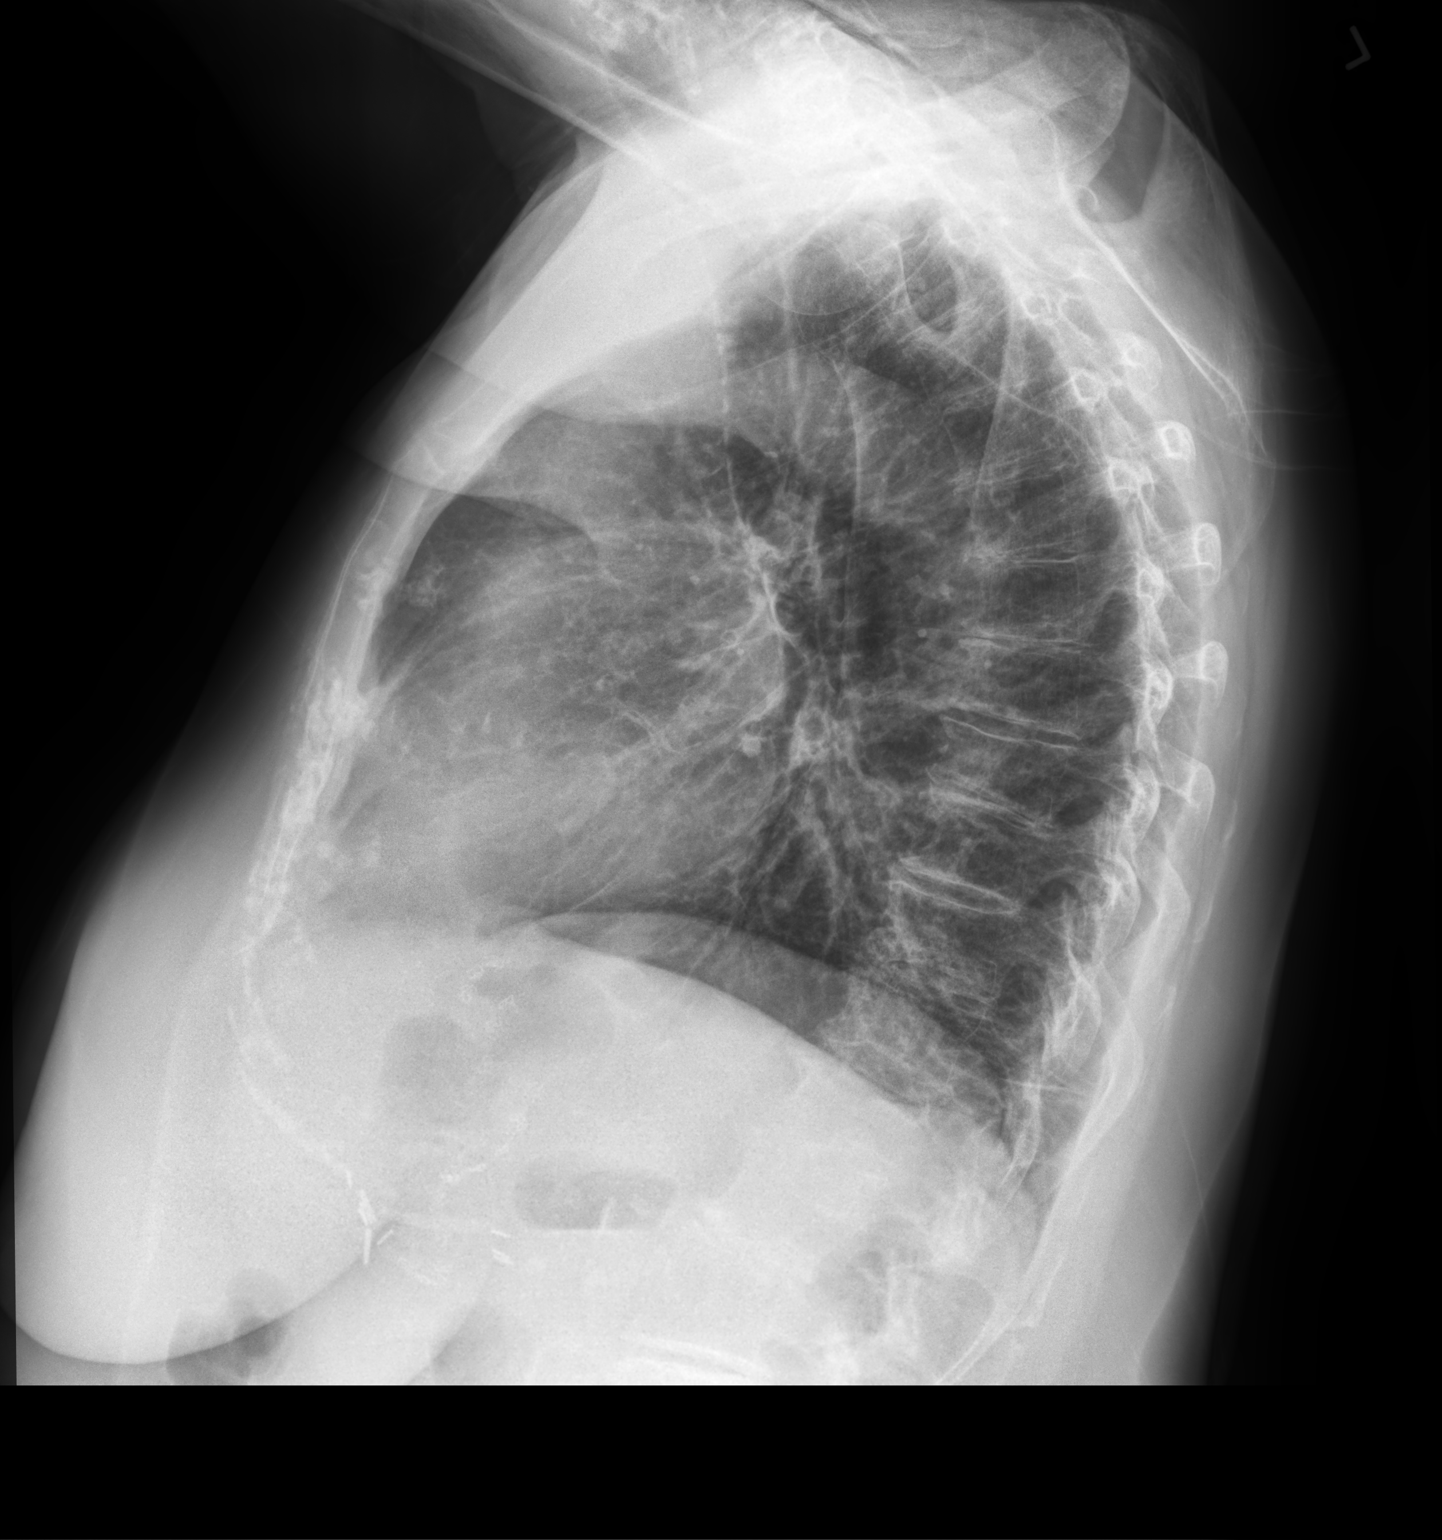

[2 of 2 positions shown; findings below may reference images not displayed]

FINDINGS: There is bibasilar atelectatic change. Lungs otherwise are clear.
Heart size and pulmonary vascularity are normal. There is aortic
atherosclerosis.

There is thoracolumbar levoscoliosis. There is degenerative change
in each shoulder. No clavicular fracture or dislocation appreciable.
There is mild anterior wedging of a midthoracic vertebral body.
IMPRESSION: Bibasilar atelectasis. Lungs otherwise clear. Heart size normal.
Aortic Atherosclerosis ([id number]-CC0.0).

Mild anterior wedging of a midthoracic vertebral body. Degenerative
change in each shoulder. No clavicular fracture evident. If there
remains concern for pathology involving the left clavicle, would
advise dedicated clavicular images to further evaluate.

## 2022-05-10 DIAGNOSIS — R030 Elevated blood-pressure reading, without diagnosis of hypertension: Secondary | ICD-10-CM | POA: Diagnosis not present

## 2022-05-10 DIAGNOSIS — R7301 Impaired fasting glucose: Secondary | ICD-10-CM | POA: Diagnosis not present

## 2022-05-10 DIAGNOSIS — E663 Overweight: Secondary | ICD-10-CM | POA: Diagnosis not present

## 2022-05-16 DIAGNOSIS — I10 Essential (primary) hypertension: Secondary | ICD-10-CM | POA: Diagnosis not present

## 2022-05-16 DIAGNOSIS — M542 Cervicalgia: Secondary | ICD-10-CM | POA: Diagnosis not present

## 2022-05-16 DIAGNOSIS — E785 Hyperlipidemia, unspecified: Secondary | ICD-10-CM | POA: Diagnosis not present

## 2022-05-16 DIAGNOSIS — R790 Abnormal level of blood mineral: Secondary | ICD-10-CM | POA: Diagnosis not present

## 2022-05-16 DIAGNOSIS — J019 Acute sinusitis, unspecified: Secondary | ICD-10-CM | POA: Diagnosis not present

## 2022-05-16 DIAGNOSIS — E663 Overweight: Secondary | ICD-10-CM | POA: Diagnosis not present

## 2022-05-16 DIAGNOSIS — R7301 Impaired fasting glucose: Secondary | ICD-10-CM | POA: Diagnosis not present

## 2022-05-16 DIAGNOSIS — Z2820 Immunization not carried out because of patient decision for unspecified reason: Secondary | ICD-10-CM | POA: Diagnosis not present

## 2022-05-16 DIAGNOSIS — Z0000 Encounter for general adult medical examination without abnormal findings: Secondary | ICD-10-CM | POA: Diagnosis not present

## 2022-05-16 DIAGNOSIS — Z1211 Encounter for screening for malignant neoplasm of colon: Secondary | ICD-10-CM | POA: Diagnosis not present

## 2022-06-20 ENCOUNTER — Ambulatory Visit (HOSPITAL_COMMUNITY): Payer: Medicare Other | Admitting: Physical Therapy

## 2022-06-24 NOTE — Therapy (Deleted)
OUTPATIENT PHYSICAL THERAPY CERVICAL EVALUATION   Patient Name: Laura Mercado MRN: [id number] DOB:[birth year], 77 y.o., female Today's Date: 06/24/2022  END OF SESSION:   Past Medical History:  Diagnosis Date   Arthritis    Complication of anesthesia    High blood pressure    Past Surgical History:  Procedure Laterality Date   CHOLECYSTECTOMY  1982   HAND SURGERY Right 1999   basil joint   SHOULDER SURGERY Right 1987   rotator cuff   STOMACH SURGERY  1982   weight loss band   There are no problems to display for this patient.   PCP: Delorise Jackson, MDHall, Edwinna Areola, MDHall, Edwinna Areola, MD REFERRING PROVIDER: ***  REFERRING DIAG: PT eval/tx for M54.2 cervicalgia THERAPY DIAG:  cervicalgia  Rationale for Evaluation and Treatment: Rehabilitation  ONSET DATE: 04/09/2022  SUBJECTIVE:                                                                                                                                                                                                         SUBJECTIVE STATEMENT: Pt fell on 04/10/2022 injuring her neck.   PERTINENT HISTORY:  ***  PAIN:  Are you having pain? Yes: NPRS scale: ***/10 Pain location: *** Pain description: *** Aggravating factors: *** Relieving factors: ***  PRECAUTIONS: None  WEIGHT BEARING RESTRICTIONS: No  FALLS:  Has patient fallen in last 6 months? {fallsyesno:27318}  LIVING ENVIRONMENT: Lives with: {[facility] lives with:25569::"lives with their family"} Lives in: {Lives in:25570} Stairs: {opstairs:27293} Has following equipment at home: {Assistive devices:23999}  OCCUPATION: ***  PLOF: {PLOF:24004}  PATIENT GOALS: ***  NEXT MD VISIT:   OBJECTIVE:   DIAGNOSTIC FINDINGS:  ***  PATIENT SURVEYS:  {rehab surveys:24030}  COGNITION: Overall cognitive status: {cognition:24006}  SENSATION: {sensation:27233}  POSTURE: {posture:25561}  PALPATION: ***   CERVICAL ROM:    {AROM/PROM:27142} ROM A/PROM (deg) eval  Flexion   Extension   Right lateral flexion   Left lateral flexion   Right rotation   Left rotation    (Blank rows = not tested)  UPPER EXTREMITY ROM:  {AROM/PROM:27142} ROM Right eval Left eval  Shoulder flexion    Shoulder extension    Shoulder abduction    Shoulder adduction    Shoulder extension    Shoulder internal rotation    Shoulder external rotation    Elbow flexion    Elbow extension    Wrist flexion    Wrist extension    Wrist ulnar deviation    Wrist radial deviation    Wrist pronation  Wrist supination     (Blank rows = not tested)  UPPER EXTREMITY MMT:  MMT Right eval Left eval  Shoulder flexion    Shoulder extension    Shoulder abduction    Shoulder adduction    Shoulder extension    Shoulder internal rotation    Shoulder external rotation    Middle trapezius    Lower trapezius    Elbow flexion    Elbow extension    Wrist flexion    Wrist extension    Wrist ulnar deviation    Wrist radial deviation    Wrist pronation    Wrist supination    Grip strength     (Blank rows = not tested)  CERVICAL SPECIAL TESTS:  {Cervical special tests:25246}  FUNCTIONAL TESTS:  {Functional tests:24029}  TODAY'S TREATMENT:                                                                                                                              DATE: ***   PATIENT EDUCATION:  Education details: *** Person educated: {Person educated:25204} Education method: {Education Method:25205} Education comprehension: {Education Comprehension:25206}  HOME EXERCISE PROGRAM: ***  ASSESSMENT:  CLINICAL IMPRESSION: Patient is a *** y.o. *** who was seen today for physical therapy evaluation and treatment for ***.   OBJECTIVE IMPAIRMENTS: {opptimpairments:25111}.   ACTIVITY LIMITATIONS: {activitylimitations:27494}  PARTICIPATION LIMITATIONS: {participationrestrictions:25113}  PERSONAL FACTORS: {Personal  factors:25162} are also affecting patient's functional outcome.   REHAB POTENTIAL: {rehabpotential:25112}  CLINICAL DECISION MAKING: {clinical decision making:25114}  EVALUATION COMPLEXITY: {Evaluation complexity:25115}   GOALS: Goals reviewed with patient? {yes/no:20286}  SHORT TERM GOALS: Target date: ***  *** Baseline: *** Goal status: {GOALSTATUS:25110}  2.  *** Baseline: *** Goal status: {GOALSTATUS:25110}  3.  *** Baseline: *** Goal status: {GOALSTATUS:25110}  4.  *** Baseline: *** Goal status: {GOALSTATUS:25110}  5.  *** Baseline: *** Goal status: {GOALSTATUS:25110}  6.  *** Baseline: *** Goal status: {GOALSTATUS:25110}  LONG TERM GOALS: Target date: ***  *** Baseline: *** Goal status: {GOALSTATUS:25110}  2.  *** Baseline: *** Goal status: {GOALSTATUS:25110}  3.  *** Baseline: *** Goal status: {GOALSTATUS:25110}  4.  *** Baseline: *** Goal status: {GOALSTATUS:25110}  5.  *** Baseline: *** Goal status: {GOALSTATUS:25110}  6.  *** Baseline: *** Goal status: {GOALSTATUS:25110}   PLAN:  PT FREQUENCY: {rehab frequency:25116}  PT DURATION: {rehab duration:25117}  PLANNED INTERVENTIONS: {rehab planned interventions:25118::"Therapeutic exercises","Therapeutic activity","Neuromuscular re-education","Balance training","Gait training","Patient/Family education","Self Care","Joint mobilization"}  PLAN FOR NEXT SESSION: ***

## 2022-06-28 ENCOUNTER — Ambulatory Visit (HOSPITAL_COMMUNITY): Payer: Medicare Other | Admitting: Physical Therapy

## 2022-07-17 ENCOUNTER — Other Ambulatory Visit: Payer: Self-pay

## 2022-07-17 ENCOUNTER — Emergency Department (HOSPITAL_COMMUNITY)
Admission: EM | Admit: 2022-07-17 | Discharge: 2022-07-17 | Disposition: A | Discharge status: Home / Self Care | Payer: Medicare Other | Attending: Emergency Medicine | Admitting: Emergency Medicine

## 2022-07-17 ENCOUNTER — Emergency Department (HOSPITAL_COMMUNITY): Payer: Medicare Other

## 2022-07-17 DIAGNOSIS — M25512 Pain in left shoulder: Secondary | ICD-10-CM | POA: Diagnosis not present

## 2022-07-17 DIAGNOSIS — M19012 Primary osteoarthritis, left shoulder: Secondary | ICD-10-CM | POA: Diagnosis not present

## 2022-07-17 DIAGNOSIS — I10 Essential (primary) hypertension: Secondary | ICD-10-CM | POA: Diagnosis not present

## 2022-07-17 MED ORDER — HYDROCODONE-ACETAMINOPHEN 5-325 MG PO TABS
1.0000 | ORAL_TABLET | Freq: Once | ORAL | Status: AC
  Administered 2022-07-17: 1 via ORAL
  Filled 2022-07-17: qty 1

## 2022-07-17 MED ORDER — HYDROCODONE-ACETAMINOPHEN 5-325 MG PO TABS: 1.0000 | ORAL_TABLET | Freq: Four times a day (QID) | ORAL | 0 refills | Status: AC | PRN

## 2022-07-17 NOTE — ED Notes (Signed)
Pt ambulated to restroom independently. Provided urine sample, left at bedside.

## 2022-07-17 NOTE — ED Provider Notes (Signed)
[facility] Provider Note   CSN: [id number] Arrival date & time: 07/17/22  0944     History  Chief Complaint  Patient presents with   Shoulder Pain    Laura Mercado is a 77 y.o. female with a history of high blood pressure and osteoarthritis, presenting with left shoulder pain.  She woke at 3 AM to discover she had severe pain in her left shoulder, she was lying on the side which she typically does for sleep, describes 10 out of 10 pain starting at the shoulder and radiating into her hand, sharp stabbing pain which was worse with any positional change or movement.  She denies chest pain, shortness of breath, diaphoresis palpitations.  She does report tripping and falling 4 months ago, landing "flat on her face" at which time she sustained cervical strain/sprain which she states is nearly resolved but is wondering whether her shoulder pain is related to that prior injury.  She denies weakness or numbness in her arms or hands.  She has taken ibuprofen 600 mg prior to arrival and her pain has significantly improved although it is still fairly severe with any movement of the shoulder joint.  The history is provided by the patient and the spouse.       Home Medications Prior to Admission medications   Medication Sig Start Date End Date Taking? Authorizing Provider  loratadine (CLARITIN) 10 MG tablet Take 10 mg by mouth daily.    [provider]  tizanidine (ZANAFLEX) 2 MG capsule Take 1 capsule (2 mg total) by mouth 3 (three) times daily as needed for muscle spasms. Do not drink alcohol or drive while taking this medication.  May cause drowsiness. 04/10/22   Volney American, PA-C      Allergies    Codeine    Review of Systems   Review of Systems  Constitutional:  Negative for chills and fever.  Respiratory: Negative.    Cardiovascular: Negative.   Gastrointestinal: Negative.   Musculoskeletal:  Positive for arthralgias.  Negative for joint swelling and myalgias.  Skin:  Negative for color change, rash and wound.  Neurological:  Negative for weakness and numbness.    Physical Exam Updated Vital Signs BP (!) 163/88   Pulse 93   Temp 97.8 F (36.6 C)   Resp 17   Ht 5' (1.524 m)   Wt 66.2 kg   SpO2 100%   BMI 28.51 kg/m  Physical Exam Constitutional:      Appearance: She is well-developed.  HENT:     Head: Atraumatic.  Cardiovascular:     Comments: Pulses equal bilaterally Musculoskeletal:        General: Tenderness present.     Cervical back: Normal range of motion.  Skin:    General: Skin is warm and dry.  Neurological:     Mental Status: She is alert.     Sensory: No sensory deficit.     Motor: No weakness.     Deep Tendon Reflexes: Reflexes normal.     ED Results / Procedures / Treatments   Labs (all labs ordered are listed, but only abnormal results are displayed) Labs Reviewed - No data to display  EKG None ED ECG REPORT   Date: 07/17/2022  Rate: 79  Rhythm: normal sinus rhythm  QRS Axis: normal  Intervals: PR shortened  ST/T Wave abnormalities: normal  Conduction Disutrbances:none  Narrative Interpretation:   Old EKG Reviewed: none available  I have personally reviewed  the EKG tracing and agree with the computerized printout as noted.  Radiology DG Shoulder Left  Result Date: 07/17/2022 CLINICAL DATA:  Acute LEFT shoulder pain.  No known injury. EXAM: LEFT SHOULDER - 2+ VIEW COMPARISON:  10/12/2019 chest radiograph FINDINGS: There is no evidence of acute fracture or dislocation. Unchanged loss of acromial humeral interval is compatible with chronic rotator cuff tear. Mild glenohumeral joint degenerative changes again noted. No focal bony lesions are present. IMPRESSION: 1. No evidence of acute abnormality. 2. Chronic rotator cuff tear. 3. Mild glenohumeral joint degenerative changes. Electronically Signed   By: Margarette Canada M.D.   On: 07/17/2022 11:30     Procedures Procedures    Medications Ordered in ED Medications  HYDROcodone-acetaminophen (NORCO/VICODIN) 5-325 MG per tablet 1 tablet (1 tablet Oral Given 07/17/22 1107)    ED Course/ Medical Decision Making/ A&P                             Medical Decision Making Patient presenting with left shoulder pain which she woke with at 3 AM today, denies any injuries or overuse.  No chest pain, no shortness of breath.  Her pain is reproducible with movement of the shoulder both active and passive range of motion suggesting musculoskeletal source.  Amount and/or Complexity of Data Reviewed Radiology: ordered.    Details: Imaging revealing osteoarthritis and suggestive of chronic rotator cuff tear.  These findings were reviewed I agree with interpretation.  This was also discussed with patient and she will follow-up with orthopedics.  She was placed in a sling, she received significant relief with the hydrocodone given and was not overly sedated with this medication.  I will prescribe her small quantity of this medication, she may also continue using her ibuprofen as needed.  Plan orthopedic referral.  Risk Prescription drug management.           Final Clinical Impression(s) / ED Diagnoses Final diagnoses:  Acute pain of left shoulder    Rx / DC Orders ED Discharge Orders     None         Landis Martins 07/17/22 1244    Davonna Belling, MD 07/17/22 [phone]

## 2022-07-17 NOTE — Discharge Instructions (Addendum)
Your x-ray shows that you have some arthritis in your shoulder and there is also a suggestion of a chronic rotator cuff tear/injury.  As discussed your EKG is normal today, your exam suggest this is strictly a shoulder joint source of your pain.  You are being prescribed some pain medication, do not drive within 4 hours of taking this medication.  I recommend a stool softener if you start to develop constipation which is a common side effect of this medication.  Heating pad applied to your shoulder 20 minutes several times daily may also be soothing.  Call the orthopedist listed for further management if your symptoms persist.

## 2022-07-17 NOTE — ED Triage Notes (Signed)
Pt arrived from home with c/o left shoulder pain that started last night around 3am. Says pain is radiating down to wrist. Has taken aspirin, Advil at home with no relief.  Pt has had previous fall on November 4th where she fell face forward.

## 2022-10-19 DIAGNOSIS — R790 Abnormal level of blood mineral: Secondary | ICD-10-CM | POA: Diagnosis not present

## 2022-10-19 DIAGNOSIS — E785 Hyperlipidemia, unspecified: Secondary | ICD-10-CM | POA: Diagnosis not present

## 2022-10-28 DIAGNOSIS — Z6828 Body mass index (BMI) 28.0-28.9, adult: Secondary | ICD-10-CM | POA: Diagnosis not present

## 2022-10-28 DIAGNOSIS — Z1211 Encounter for screening for malignant neoplasm of colon: Secondary | ICD-10-CM | POA: Diagnosis not present

## 2022-10-28 DIAGNOSIS — R7301 Impaired fasting glucose: Secondary | ICD-10-CM | POA: Diagnosis not present

## 2022-10-28 DIAGNOSIS — Z7182 Exercise counseling: Secondary | ICD-10-CM | POA: Diagnosis not present

## 2022-10-28 DIAGNOSIS — R790 Abnormal level of blood mineral: Secondary | ICD-10-CM | POA: Diagnosis not present

## 2022-10-28 DIAGNOSIS — E782 Mixed hyperlipidemia: Secondary | ICD-10-CM | POA: Diagnosis not present

## 2022-10-28 DIAGNOSIS — Z713 Dietary counseling and surveillance: Secondary | ICD-10-CM | POA: Diagnosis not present

## 2022-10-28 DIAGNOSIS — M19012 Primary osteoarthritis, left shoulder: Secondary | ICD-10-CM | POA: Diagnosis not present

## 2022-10-28 DIAGNOSIS — E663 Overweight: Secondary | ICD-10-CM | POA: Diagnosis not present

## 2022-10-28 DIAGNOSIS — I10 Essential (primary) hypertension: Secondary | ICD-10-CM | POA: Diagnosis not present

## 2022-10-28 DIAGNOSIS — J302 Other seasonal allergic rhinitis: Secondary | ICD-10-CM | POA: Diagnosis not present

## 2023-05-26 DIAGNOSIS — R7301 Impaired fasting glucose: Secondary | ICD-10-CM | POA: Diagnosis not present

## 2023-05-26 DIAGNOSIS — E782 Mixed hyperlipidemia: Secondary | ICD-10-CM | POA: Diagnosis not present

## 2023-06-01 DIAGNOSIS — E663 Overweight: Secondary | ICD-10-CM | POA: Diagnosis not present

## 2023-06-01 DIAGNOSIS — J302 Other seasonal allergic rhinitis: Secondary | ICD-10-CM | POA: Diagnosis not present

## 2023-06-01 DIAGNOSIS — Z713 Dietary counseling and surveillance: Secondary | ICD-10-CM | POA: Diagnosis not present

## 2023-06-01 DIAGNOSIS — E782 Mixed hyperlipidemia: Secondary | ICD-10-CM | POA: Diagnosis not present

## 2023-06-01 DIAGNOSIS — M19012 Primary osteoarthritis, left shoulder: Secondary | ICD-10-CM | POA: Diagnosis not present

## 2023-06-01 DIAGNOSIS — R7989 Other specified abnormal findings of blood chemistry: Secondary | ICD-10-CM | POA: Diagnosis not present

## 2023-06-01 DIAGNOSIS — I10 Essential (primary) hypertension: Secondary | ICD-10-CM | POA: Diagnosis not present

## 2023-06-01 DIAGNOSIS — R790 Abnormal level of blood mineral: Secondary | ICD-10-CM | POA: Diagnosis not present

## 2023-06-01 DIAGNOSIS — Z7182 Exercise counseling: Secondary | ICD-10-CM | POA: Diagnosis not present

## 2023-06-01 DIAGNOSIS — R7301 Impaired fasting glucose: Secondary | ICD-10-CM | POA: Diagnosis not present

## 2023-06-01 DIAGNOSIS — M542 Cervicalgia: Secondary | ICD-10-CM | POA: Diagnosis not present
